# Patient Record
Sex: Male | Born: 2000 | Race: White | Hispanic: No | Marital: Single | Smoking: Former smoker
Health system: Southern US, Community
[De-identification: ages and names within clinical notes are randomized; demographics above are authoritative.]

## PROBLEM LIST (undated history)

## (undated) DIAGNOSIS — F909 Attention-deficit hyperactivity disorder, unspecified type: Secondary | ICD-10-CM

## (undated) DIAGNOSIS — J050 Acute obstructive laryngitis [croup]: Secondary | ICD-10-CM

## (undated) DIAGNOSIS — J3089 Other allergic rhinitis: Secondary | ICD-10-CM

## (undated) HISTORY — PX: NO PAST SURGERIES: SHX2092

## (undated) HISTORY — DX: Acute obstructive laryngitis (croup): J05.0

## (undated) HISTORY — DX: Other allergic rhinitis: J30.89

## (undated) HISTORY — DX: Attention-deficit hyperactivity disorder, unspecified type: F90.9

---
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
## 2021-07-18 ENCOUNTER — Emergency Department (HOSPITAL_BASED_OUTPATIENT_CLINIC_OR_DEPARTMENT_OTHER): Payer: 59

## 2021-07-18 ENCOUNTER — Encounter (HOSPITAL_BASED_OUTPATIENT_CLINIC_OR_DEPARTMENT_OTHER): Payer: Self-pay | Admitting: Emergency Medicine

## 2021-07-18 ENCOUNTER — Ambulatory Visit (INDEPENDENT_AMBULATORY_CARE_PROVIDER_SITE_OTHER)
Admission: EM | Admit: 2021-07-18 | Discharge: 2021-07-18 | Disposition: A | Discharge status: Other Acute Inpatient Hospital | Payer: 59 | Source: Home / Self Care

## 2021-07-18 ENCOUNTER — Ambulatory Visit: Payer: Self-pay

## 2021-07-18 ENCOUNTER — Emergency Department (HOSPITAL_BASED_OUTPATIENT_CLINIC_OR_DEPARTMENT_OTHER)
Admission: EM | Admit: 2021-07-18 | Discharge: 2021-07-18 | Disposition: A | Discharge status: Home / Self Care | Payer: 59 | Attending: Emergency Medicine | Admitting: Emergency Medicine

## 2021-07-18 ENCOUNTER — Other Ambulatory Visit: Payer: Self-pay

## 2021-07-18 DIAGNOSIS — J029 Acute pharyngitis, unspecified: Secondary | ICD-10-CM | POA: Diagnosis not present

## 2021-07-18 DIAGNOSIS — J390 Retropharyngeal and parapharyngeal abscess: Secondary | ICD-10-CM | POA: Diagnosis not present

## 2021-07-18 DIAGNOSIS — R220 Localized swelling, mass and lump, head: Secondary | ICD-10-CM | POA: Diagnosis present

## 2021-07-18 DIAGNOSIS — Z91010 Allergy to peanuts: Secondary | ICD-10-CM | POA: Diagnosis not present

## 2021-07-18 DIAGNOSIS — Z112 Encounter for screening for other bacterial diseases: Secondary | ICD-10-CM | POA: Insufficient documentation

## 2021-07-18 DIAGNOSIS — D72829 Elevated white blood cell count, unspecified: Secondary | ICD-10-CM | POA: Diagnosis not present

## 2021-07-18 LAB — BASIC METABOLIC PANEL
Anion gap: 14 (ref 5–15)
BUN: 9 mg/dL (ref 6–20)
CO2: 24 mmol/L (ref 22–32)
Calcium: 9.6 mg/dL (ref 8.9–10.3)
Chloride: 100 mmol/L (ref 98–111)
Creatinine, Ser: 0.94 mg/dL (ref 0.61–1.24)
GFR, Estimated: >60 mL/min (ref >60)
Glucose, Bld: 94 mg/dL (ref 70–99)
Potassium: 3.9 mmol/L (ref 3.5–5.1)
Sodium: 138 mmol/L (ref 135–145)

## 2021-07-18 LAB — CBC WITH DIFFERENTIAL/PLATELET
Abs Immature Granulocytes: 0.05 10*3/uL (ref 0.00–0.07)
Basophils Absolute: 0 10*3/uL (ref 0.0–0.1)
Basophils Relative: 0 %
Eosinophils Absolute: 0.1 10*3/uL (ref 0.0–0.5)
Eosinophils Relative: 1 %
HCT: 43.3 % (ref 39.0–52.0)
Hemoglobin: 15 g/dL (ref 13.0–17.0)
Immature Granulocytes: 0 %
Lymphocytes Relative: 12 %
Lymphs Abs: 1.6 10*3/uL (ref 0.7–4.0)
MCH: 30.2 pg (ref 26.0–34.0)
MCHC: 34.6 g/dL (ref 30.0–36.0)
MCV: 87.3 fL (ref 80.0–100.0)
Monocytes Absolute: 1.4 10*3/uL — ABNORMAL HIGH (ref 0.1–1.0)
Monocytes Relative: 10 %
Neutro Abs: 10.1 10*3/uL — ABNORMAL HIGH (ref 1.7–7.7)
Neutrophils Relative %: 77 %
Platelets: 259 10*3/uL (ref 150–400)
RBC: 4.96 MIL/uL (ref 4.22–5.81)
RDW: 11.9 % (ref 11.5–15.5)
WBC: 13.2 10*3/uL — ABNORMAL HIGH (ref 4.0–10.5)
nRBC: 0 % (ref 0.0–0.2)

## 2021-07-18 LAB — POCT RAPID STREP A (OFFICE): Rapid Strep A Screen: NEGATIVE

## 2021-07-18 MED ORDER — KETOROLAC TROMETHAMINE 30 MG/ML IJ SOLN
30.0000 mg | Freq: Once | INTRAMUSCULAR | Status: AC
  Administered 2021-07-18: 30 mg via INTRAVENOUS
  Filled 2021-07-18: qty 1

## 2021-07-18 MED ORDER — METHYLPREDNISOLONE 4 MG PO TBPK: ORAL_TABLET | ORAL | 0 refills | Status: AC

## 2021-07-18 MED ORDER — NAPROXEN 500 MG PO TABS: 500.0000 mg | ORAL_TABLET | Freq: Two times a day (BID) | ORAL | 0 refills | Status: AC

## 2021-07-18 MED ORDER — ACETAMINOPHEN 500 MG PO TABS
1000.0000 mg | ORAL_TABLET | Freq: Once | ORAL | Status: AC
  Administered 2021-07-18: 1000 mg via ORAL

## 2021-07-18 MED ORDER — SODIUM CHLORIDE 0.9 % IV BOLUS
1000.0000 mL | Freq: Once | INTRAVENOUS | Status: AC
  Administered 2021-07-18: 1000 mL via INTRAVENOUS

## 2021-07-18 MED ORDER — DEXAMETHASONE SODIUM PHOSPHATE 10 MG/ML IJ SOLN
10.0000 mg | Freq: Once | INTRAMUSCULAR | Status: AC
  Administered 2021-07-18: 10 mg via INTRAVENOUS
  Filled 2021-07-18: qty 1

## 2021-07-18 MED ORDER — IOHEXOL 300 MG/ML  SOLN
100.0000 mL | Freq: Once | INTRAMUSCULAR | Status: AC | PRN
  Administered 2021-07-18: 75 mL via INTRAVENOUS

## 2021-07-18 NOTE — Discharge Instructions (Signed)
You have been seen and discharged from the emergency department.  Strep test was negative. Take prescriptions as prescribed.  Make sure to eat something small/drink something with the Naprosyn to avoid any stomach irritation.  Stay well-hydrated.  Follow-up with your primary provider for further evaluation and further care. Take home medications as prescribed. If you have any worsening symptoms or further concerns for your health please return to an emergency department for further evaluation.

## 2021-07-18 NOTE — Discharge Instructions (Addendum)
-  You have a large abscess in the back of your throat.  This is so big, that I am worried that it could compromise your airway, so this needs to be managed in the emergency department.  They will probably do imaging of your head and neck, then drain the abscess, and decide whether to do inpatient or outpatient antibiotics.

## 2021-07-18 NOTE — ED Triage Notes (Signed)
Pt reports, on and off sore throat and body aches x 5 days. States is hard to swallow. States right side of his throat was bleeding today.

## 2021-07-18 NOTE — ED Provider Notes (Signed)
[facility] Provider Note   CSN: [id number] Arrival date & time: 07/18/21  1603     History  Chief Complaint  Patient presents with   Oral Swelling    Bryan [name] is a 21 y.o. male.  HPI  21 year old male presents emergency department concern for sore throat.  Patient states has been going on for the past 5 days, worse on the right.  He is not having difficulty swallowing with severe pain.  Intermittent fevers.  Was reportedly seen in urgent care prior to this, they believe that his strep test was positive however they were concerned for abscess and sent him here for imaging and further treatment.  Patient denies any drooling or change in voice just states that it is very sore to speak/swallow.  Is never had any symptoms like this before.  Home Medications Prior to Admission medications   Medication Sig Start Date End Date Taking? Authorizing Provider  acetaminophen (TYLENOL) 500 MG tablet Take 1,000 mg by mouth every 6 (six) hours as needed for headache.    [provider]  EPINEPHrine (EPIPEN 2-PAK) 0.3 mg/0.3 mL IJ SOAJ injection Inject 0.3 mLs (0.3 mg total) into the muscle once. 07/20/15   Gean Quint, MD  Vitamin D, Ergocalciferol, (DRISDOL) 50000 units CAPS capsule Take 1 capsule (50,000 Units total) by mouth every 7 (seven) days. 10/07/17   Laqueta Linden, MD      Allergies    Peanut-containing drug products, Amoxil [amoxicillin], and Other    Review of Systems   Review of Systems  Constitutional:  Positive for appetite change, fatigue and fever.  HENT:  Positive for sore throat and trouble swallowing. Negative for congestion, drooling, facial swelling, sinus pressure and voice change.   Respiratory:  Negative for shortness of breath.   Cardiovascular:  Negative for chest pain.  Gastrointestinal:  Negative for abdominal pain, diarrhea and vomiting.  Musculoskeletal:  Negative for neck pain and neck stiffness.  Skin:   Negative for rash.  Neurological:  Negative for headaches.   Physical Exam Updated Vital Signs BP (!) 133/101    Pulse 100    Temp 99.3 F (37.4 C) (Oral)    Resp 18    SpO2 98%  Physical Exam Vitals and nursing note reviewed.  Constitutional:      Appearance: Normal appearance.  HENT:     Head: Normocephalic.     Mouth/Throat:     Comments: Trismus, pharyngeal erythema with tonsillar exudates, large amount of right tonsillar swelling, uvula is midline Neck:     Comments: No stridor Cardiovascular:     Rate and Rhythm: Normal rate.  Pulmonary:     Effort: Pulmonary effort is normal. No respiratory distress.  Abdominal:     Palpations: Abdomen is soft.     Tenderness: There is no abdominal tenderness.  Skin:    General: Skin is warm.  Neurological:     Mental Status: He is alert and oriented to person, place, and time. Mental status is at baseline.  Psychiatric:        Mood and Affect: Mood normal.    ED Results / Procedures / Treatments   Labs (all labs ordered are listed, but only abnormal results are displayed) Labs Reviewed  CBC WITH DIFFERENTIAL/PLATELET  BASIC METABOLIC PANEL    EKG None  Radiology No results found.  Procedures Procedures    Medications Ordered in ED Medications  sodium chloride 0.9 % bolus 1,000 mL (has no administration in  time range)  ketorolac (TORADOL) 30 MG/ML injection 30 mg (has no administration in time range)  dexamethasone (DECADRON) injection 10 mg (has no administration in time range)    ED Course/ Medical Decision Making/ A&P                           Medical Decision Making Amount and/or Complexity of Data Reviewed Labs: ordered. Radiology: ordered.  Risk Prescription drug management.   22 year old male presents emergency department from urgent care with concern for tonsillar swelling and potential of peritonsillar abscess.  Patient states for the past 5 days he has been having a sore throat, mild swelling, pain  with swallowing.  Intermittent fever.  Had a strep test at urgent care that was negative.  Patient is afebrile here.  He does have tonsillar swelling, right greater than left, uvula is midline.  Palpable cervical lymphadenopathy.  Blood work shows a mild leukocytosis of 13 but is otherwise reassuring.  CT of the neck shows pharyngitis with severe tonsillar swelling and cervical lymphadenopathy without any focal abscess.  After IV medications patient feels significantly improved, has been able to eat and drink.  Plan for outpatient symptomatic treatment and follow-up.  Patient at this time appears safe and stable for discharge and close outpatient follow up. Discharge plan and strict return to ED precautions discussed, patient verbalizes understanding and agreement.        Final Clinical Impression(s) / ED Diagnoses Final diagnoses:  None    Rx / DC Orders ED Discharge Orders     None         Lorelle Konecny, DO 07/18/21 2121

## 2021-07-18 NOTE — ED Provider Notes (Signed)
[facility]    CSN: [id number] Arrival date & time: 07/18/21  1455      History   Chief Complaint Chief Complaint  Patient presents with   Appointment    1500   Sore Throat        Generalized Body Aches    HPI Bryan [name] is a 21 y.o. male presenting with sore throat, trouble swallowing, shortness of breath.  History noncontributory.  Has not attempted medications today.  Difficulty handling secretions and swallowing.  HPI  Past Medical History:  Diagnosis Date   ADHD (attention deficit hyperactivity disorder)    Croup    Recurrent with reactive airway   Environmental and seasonal allergies     Patient Active Problem List   Diagnosis Date Noted   Allergic rhinitis 08/11/2012   Reactive airway disease 08/11/2012    Past Surgical History:  Procedure Laterality Date   NO PAST SURGERIES         Home Medications    Prior to Admission medications   Medication Sig Start Date End Date Taking? Authorizing Provider  acetaminophen (TYLENOL) 500 MG tablet Take 1,000 mg by mouth every 6 (six) hours as needed for headache.    [provider]  EPINEPHrine (EPIPEN 2-PAK) 0.3 mg/0.3 mL IJ SOAJ injection Inject 0.3 mLs (0.3 mg total) into the muscle once. 07/20/15   Gean Quint, MD  Vitamin D, Ergocalciferol, (DRISDOL) 50000 units CAPS capsule Take 1 capsule (50,000 Units total) by mouth every 7 (seven) days. 10/07/17   Laqueta Linden, MD    Family History Family History  Problem Relation Age of Onset   Diabetes Paternal Uncle    Diabetes Paternal Grandfather        colon   Urticaria Father    Allergic rhinitis Father    Asthma Paternal Grandmother    Angioedema Neg Hx    Atopy Neg Hx    Eczema Neg Hx    Immunodeficiency Neg Hx     Social History Social History   Tobacco Use   Smoking status: Former    Types: Cigarettes   Smokeless tobacco: Never   Tobacco comments:    noone in  home smokes  Substance Use Topics    Alcohol use: Yes   Drug use: Never     Allergies   Peanut-containing drug products, Amoxil [amoxicillin], and Other   Review of Systems Review of Systems  Constitutional:  Negative for appetite change, chills and fever.  HENT:  Positive for sore throat, trouble swallowing and voice change. Negative for congestion, ear pain, rhinorrhea, sinus pressure and sinus pain.   Eyes:  Negative for redness and visual disturbance.  Respiratory:  Negative for cough, chest tightness, shortness of breath and wheezing.   Cardiovascular:  Negative for chest pain and palpitations.  Gastrointestinal:  Negative for abdominal pain, constipation, diarrhea, nausea and vomiting.  Genitourinary:  Negative for dysuria, frequency and urgency.  Musculoskeletal:  Negative for myalgias.  Neurological:  Negative for dizziness, weakness and headaches.  Psychiatric/Behavioral:  Negative for confusion.   All other systems reviewed and are negative.   Physical Exam Triage Vital Signs ED Triage Vitals  Enc Vitals Group     BP 07/18/21 1502 (!) 142/84     Pulse Rate 07/18/21 1502 (!) 108     Resp 07/18/21 1502 18     Temp 07/18/21 1502 (!) 101.1 F (38.4 C)     Temp Source 07/18/21 1502 Oral     SpO2  07/18/21 1502 96 %     Weight --      Height --      Head Circumference --      Peak Flow --      Pain Score 07/18/21 1503 9     Pain Loc --      Pain Edu? --      Excl. in [place]? --    No data found.  Updated Vital Signs BP (!) 142/84 (BP Location: Right Arm)    Pulse (!) 108    Temp (!) 101.1 F (38.4 C) (Oral)    Resp 18    SpO2 96%   Visual Acuity Right Eye Distance:   Left Eye Distance:   Bilateral Distance:    Right Eye Near:   Left Eye Near:    Bilateral Near:     Physical Exam Vitals reviewed.  Constitutional:      General: He is not in acute distress.    Appearance: Normal appearance. He is not ill-appearing.  HENT:     Head: Normocephalic and atraumatic.     Right Ear: Tympanic  membrane, ear canal and external ear normal. No tenderness. No middle ear effusion. There is no impacted cerumen. Tympanic membrane is not perforated, erythematous, retracted or bulging.     Left Ear: Tympanic membrane, ear canal and external ear normal. No tenderness.  No middle ear effusion. There is no impacted cerumen. Tympanic membrane is not perforated, erythematous, retracted or bulging.     Nose: Nose normal. No congestion.     Mouth/Throat:     Mouth: Mucous membranes are moist.     Pharynx: Uvula midline. Posterior oropharyngeal erythema present. No oropharyngeal exudate.     Tonsils: Tonsillar abscess present. 2+ on the right. 1+ on the left.     Comments: Right tonsil is distended towards uvula; uvula is not midline.  Muffled phonation, trismus. Eyes:     Extraocular Movements: Extraocular movements intact.     Pupils: Pupils are equal, round, and reactive to light.  Cardiovascular:     Rate and Rhythm: Normal rate and regular rhythm.     Heart sounds: Normal heart sounds.  Pulmonary:     Effort: Pulmonary effort is normal.     Breath sounds: Normal breath sounds. No decreased breath sounds, wheezing, rhonchi or rales.  Abdominal:     Palpations: Abdomen is soft.     Tenderness: There is no abdominal tenderness. There is no guarding or rebound.  Lymphadenopathy:     Cervical: No cervical adenopathy.     Right cervical: No superficial cervical adenopathy.    Left cervical: No superficial cervical adenopathy.  Neurological:     General: No focal deficit present.     Mental Status: He is alert and oriented to person, place, and time.  Psychiatric:        Mood and Affect: Mood normal.        Behavior: Behavior normal.        Thought Content: Thought content normal.        Judgment: Judgment normal.     UC Treatments / Results  Labs (all labs ordered are listed, but only abnormal results are displayed) Labs Reviewed  POCT RAPID STREP A (OFFICE)     EKG   Radiology No results found.  Procedures Procedures (including critical care time)  Medications Ordered in UC Medications  acetaminophen (TYLENOL) tablet 1,000 mg (1,000 mg Oral Given 07/18/21 1508)    Initial Impression / Assessment  and Plan / UC Course  I have reviewed the triage vital signs and the nursing notes.  Pertinent labs & imaging results that were available during my care of the patient were reviewed by me and considered in my medical decision making (see chart for details).     This patient is a very pleasant 21 y.o. year old male presenting with retropharyngeal abscess. Tachycardic and febrile; acetaminophen administered during visit. Rapid strep negative, culture sent.  Given change in phonation, difficulty handling secretions, and trismus, I am concerned that the abscess is too significant to be managed outpatient.  Sent to emergency department via personal vehicle, he states he will head straight there..   Final Clinical Impressions(s) / UC Diagnoses   Final diagnoses:  Retropharyngeal abscess  Screening for streptococcal infection     Discharge Instructions      -You have a large abscess in the back of your throat.  This is so big, that I am worried that it could compromise your airway, so this needs to be managed in the emergency department.  They will probably do imaging of your head and neck, then drain the abscess, and decide whether to do inpatient or outpatient antibiotics.   ED Prescriptions   None    PDMP not reviewed this encounter.   Hazel Sams, PA-C 07/18/21 1523

## 2021-07-18 NOTE — ED Triage Notes (Signed)
Pt states for about a week has been congested and sore throat. Last night his throat felt like it was swelling, extremely painful . Seen at urgent care ,tested positive for strep and sent here as provider saw large lump and concerned for patients airway.

## 2021-07-18 NOTE — ED Notes (Signed)
Patient is being discharged from the Urgent Care and sent to the Emergency Department via private vehicle . Per PA, patient is in need of higher level of care due to throat abscess. Patient is aware and verbalizes understanding of plan of care.

## 2021-07-21 LAB — CULTURE, GROUP A STREP (THRC)

## 2021-09-18 ENCOUNTER — Ambulatory Visit
Admission: RE | Admit: 2021-09-18 | Discharge: 2021-09-18 | Disposition: A | Discharge status: Home / Self Care | Payer: Commercial Managed Care - PPO | Source: Ambulatory Visit | Attending: Chiropractic Medicine | Admitting: Chiropractic Medicine

## 2021-09-18 ENCOUNTER — Other Ambulatory Visit: Payer: Self-pay | Admitting: Chiropractic Medicine

## 2021-09-18 DIAGNOSIS — M79604 Pain in right leg: Secondary | ICD-10-CM

## 2022-05-10 ENCOUNTER — Ambulatory Visit (INDEPENDENT_AMBULATORY_CARE_PROVIDER_SITE_OTHER): Payer: Commercial Managed Care - PPO

## 2022-05-10 ENCOUNTER — Ambulatory Visit
Admission: EM | Admit: 2022-05-10 | Discharge: 2022-05-10 | Disposition: A | Discharge status: Home / Self Care | Payer: Commercial Managed Care - PPO | Attending: Emergency Medicine | Admitting: Emergency Medicine

## 2022-05-10 DIAGNOSIS — U071 COVID-19: Secondary | ICD-10-CM | POA: Diagnosis not present

## 2022-05-10 DIAGNOSIS — R051 Acute cough: Secondary | ICD-10-CM

## 2022-05-10 DIAGNOSIS — R112 Nausea with vomiting, unspecified: Secondary | ICD-10-CM

## 2022-05-10 MED ORDER — MOLNUPIRAVIR EUA 200MG CAPSULE: 4.0000 | ORAL_CAPSULE | Freq: Two times a day (BID) | ORAL | 0 refills | Status: AC

## 2022-05-10 MED ORDER — ALBUTEROL SULFATE HFA 108 (90 BASE) MCG/ACT IN AERS: 1.0000 | INHALATION_SPRAY | Freq: Four times a day (QID) | RESPIRATORY_TRACT | 0 refills | Status: AC | PRN

## 2022-05-10 NOTE — ED Triage Notes (Signed)
Patient to Urgent Care with complaints of nasal congestion/ wheezing/ cough/ and generalized body aches x2 days. Also reports nausea and vomiting.   Reports that he had a positive covid test at home two days ago.   Fevers yesterday, max temp 102. Last dose of tylenol 2130.

## 2022-05-10 NOTE — Discharge Instructions (Addendum)
Use the albuterol inhaler as directed.  Take the molnupiravir as directed.    Go to the emergency department if you have shortness of breath or other concerning symptoms.   Follow up with your primary care provider.

## 2022-05-10 NOTE — ED Provider Notes (Signed)
Bryan [name]    CSN: [id number] Arrival date & time: 05/10/22  0855      History   Chief Complaint Chief Complaint  Patient presents with   Covid Positive    HPI Bryan [name] is a 21 y.o. male.  Patient presents with 2 day history of fever, body aches, congestion, wheezing, cough, nausea, vomiting. Tmax 102.  One episode of emesis last night; none today.  He tested COVID positive at home on 05/08/2022.  Treatment at home with Tylenol; last taken yesterday.  He denies shortness of breath, chest pain, abdominal pain, diarrhea, or other symptoms.  His medical history includes reactive airway disease, allergies, croup, ADHD.   Patient reports he has not required an inhaler in many years.    The history is provided by the patient and medical records.    Past Medical History:  Diagnosis Date   ADHD (attention deficit hyperactivity disorder)    Croup    Recurrent with reactive airway   Environmental and seasonal allergies     Patient Active Problem List   Diagnosis Date Noted   Allergic rhinitis 08/11/2012   Reactive airway disease 08/11/2012    Past Surgical History:  Procedure Laterality Date   NO PAST SURGERIES         Home Medications    Prior to Admission medications   Medication Sig Start Date End Date Taking? Authorizing Provider  albuterol (VENTOLIN HFA) 108 (90 Base) MCG/ACT inhaler Inhale 1-2 puffs into the lungs every 6 (six) hours as needed. 05/10/22  Yes Mickie Bail, NP  molnupiravir EUA (LAGEVRIO) 200 mg CAPS capsule Take 4 capsules (800 mg total) by mouth 2 (two) times daily for 5 days. 05/10/22 05/15/22 Yes Mickie Bail, NP  acetaminophen (TYLENOL) 500 MG tablet Take 1,000 mg by mouth every 6 (six) hours as needed for headache.    [provider]  EPINEPHrine (EPIPEN 2-PAK) 0.3 mg/0.3 mL IJ SOAJ injection Inject 0.3 mLs (0.3 mg total) into the muscle once. 07/20/15   Baxter Hire, MD  methylPREDNISolone (MEDROL DOSEPAK) 4 MG TBPK  tablet Take as directed starting 2/29/23 07/18/21   Horton, Clabe Seal, DO  naproxen (NAPROSYN) 500 MG tablet Take 1 tablet (500 mg total) by mouth 2 (two) times daily. 07/18/21   Horton, Clabe Seal, DO  Vitamin D, Ergocalciferol, (DRISDOL) 50000 units CAPS capsule Take 1 capsule (50,000 Units total) by mouth every 7 (seven) days. 10/07/17   Langston Reusing, MD    Family History Family History  Problem Relation Age of Onset   Diabetes Paternal Uncle    Diabetes Paternal Grandfather        colon   Urticaria Father    Allergic rhinitis Father    Asthma Paternal Grandmother    Angioedema Neg Hx    Atopy Neg Hx    Eczema Neg Hx    Immunodeficiency Neg Hx     Social History Social History   Tobacco Use   Smoking status: Former    Types: Cigarettes   Smokeless tobacco: Never   Tobacco comments:    noone in  home smokes  Substance Use Topics   Alcohol use: Yes   Drug use: Never     Allergies   Peanut-containing drug products, Amoxil [amoxicillin], and Other   Review of Systems Review of Systems  Constitutional:  Positive for fever. Negative for chills.  HENT:  Positive for congestion. Negative for ear pain and sore throat.   Respiratory:  Positive for cough and wheezing. Negative for shortness of breath.   Cardiovascular:  Negative for chest pain and palpitations.  Gastrointestinal:  Positive for nausea and vomiting. Negative for abdominal pain and diarrhea.  Skin:  Negative for rash.  All other systems reviewed and are negative.    Physical Exam Triage Vital Signs ED Triage Vitals  Enc Vitals Group     BP      Pulse      Resp      Temp      Temp src      SpO2      Weight      Height      Head Circumference      Peak Flow      Pain Score      Pain Loc      Pain Edu?      Excl. in GC?    No data found.  Updated Vital Signs BP 118/85   Pulse 91   Temp 97.7 F (36.5 C)   Resp 18   Ht 6\' 1"  (1.854 m)   SpO2 97%   Visual Acuity Right Eye  Distance:   Left Eye Distance:   Bilateral Distance:    Right Eye Near:   Left Eye Near:    Bilateral Near:     Physical Exam Vitals and nursing note reviewed.  Constitutional:      General: He is not in acute distress.    Appearance: He is well-developed. He is obese. He is ill-appearing.  HENT:     Right Ear: Tympanic membrane normal.     Left Ear: Tympanic membrane normal.     Nose: Congestion and rhinorrhea present.     Mouth/Throat:     Mouth: Mucous membranes are moist.     Pharynx: Oropharynx is clear.  Cardiovascular:     Rate and Rhythm: Normal rate and regular rhythm.     Heart sounds: Normal heart sounds.  Pulmonary:     Effort: Pulmonary effort is normal. No respiratory distress.     Breath sounds: Rhonchi present.     Comments: Left side rhonchi Abdominal:     General: Bowel sounds are normal.     Palpations: Abdomen is soft.     Tenderness: There is no abdominal tenderness. There is no guarding or rebound.  Musculoskeletal:     Cervical back: Neck supple.  Skin:    General: Skin is warm and dry.  Neurological:     Mental Status: He is alert.  Psychiatric:        Mood and Affect: Mood normal.        Behavior: Behavior normal.      UC Treatments / Results  Labs (all labs ordered are listed, but only abnormal results are displayed) Labs Reviewed - No data to display  EKG   Radiology DG Chest 2 View  Result Date: 05/10/2022 CLINICAL DATA:  productive cough, COVID positive EXAM: CHEST - 2 VIEW COMPARISON:  02/28/2015 FINDINGS: Cardiac silhouette is unremarkable. No pneumothorax or pleural effusion. The lungs are clear. The visualized skeletal structures are unremarkable. IMPRESSION: No acute cardiopulmonary process. Electronically Signed   By: 04/30/2015 M.D.   On: 05/10/2022 09:42    Procedures Procedures (including critical care time)  Medications Ordered in UC Medications - No data to display  Initial Impression / Assessment and Plan /  UC Course  I have reviewed the triage vital signs and the nursing notes.  Pertinent labs &  imaging results that were available during my care of the patient were reviewed by me and considered in my medical decision making (see chart for details).    COVID-19, cough, nausea and vomiting.  VSS.  CXR negative.  Discussed treatment options.  Treating with molnupiravir .  Discussed that this is an emergency authorized medication for treatment of COVID.  Discussed side effects including nausea, diarrhea, dizziness.  Also treating with albuterol inhaler as patient has history of reactive airway.  Discussed other symptomatic treatment including Tylenol, rest, hydration.  Instructed patient to follow-up with PCP if symptoms are not improving.  ED precautions discussed.  Patient agrees to plan of care.   Final Clinical Impressions(s) / UC Diagnoses   Final diagnoses:  COVID-19  Acute cough  Nausea and vomiting, unspecified vomiting type     Discharge Instructions      Use the albuterol inhaler as directed.  Take the molnupiravir as directed.    Go to the emergency department if you have shortness of breath or other concerning symptoms.   Follow up with your primary care provider.        ED Prescriptions     Medication Sig Dispense Auth. Provider   albuterol (VENTOLIN HFA) 108 (90 Base) MCG/ACT inhaler Inhale 1-2 puffs into the lungs every 6 (six) hours as needed. 18 g Mickie Bail, NP   molnupiravir EUA (LAGEVRIO) 200 mg CAPS capsule Take 4 capsules (800 mg total) by mouth 2 (two) times daily for 5 days. 40 capsule Mickie Bail, NP      PDMP not reviewed this encounter.   Mickie Bail, NP 05/10/22 1004

## 2022-11-15 IMAGING — CR DG LUMBAR SPINE COMPLETE 4+V
5 series · 5 of 5 positions shown · non-contrast
Comparison: None.

CLINICAL DATA: Acute low back pain radiating down right leg to
foot. Numbness and tingling, right foot.

EXAM:
LUMBAR SPINE - COMPLETE 4+ VIEW

[w lumbar spine ap]
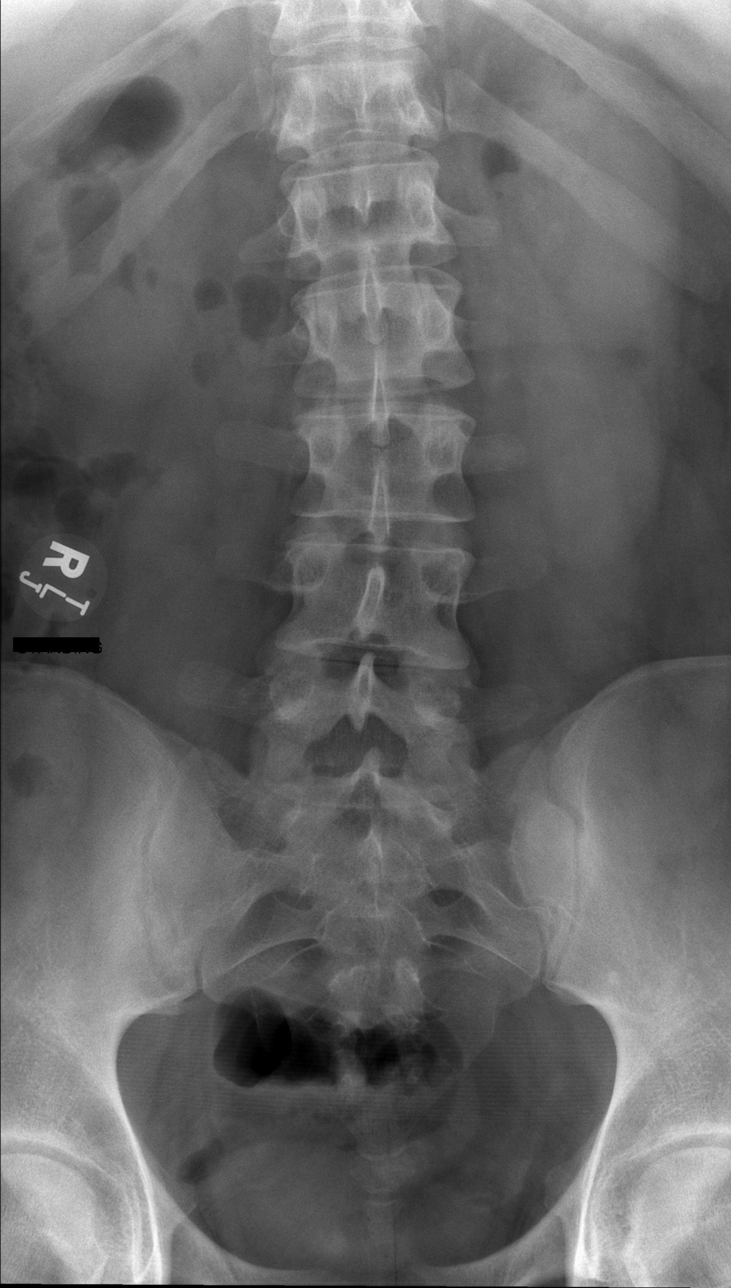

[w lumbar spine obl (1 of 2)]
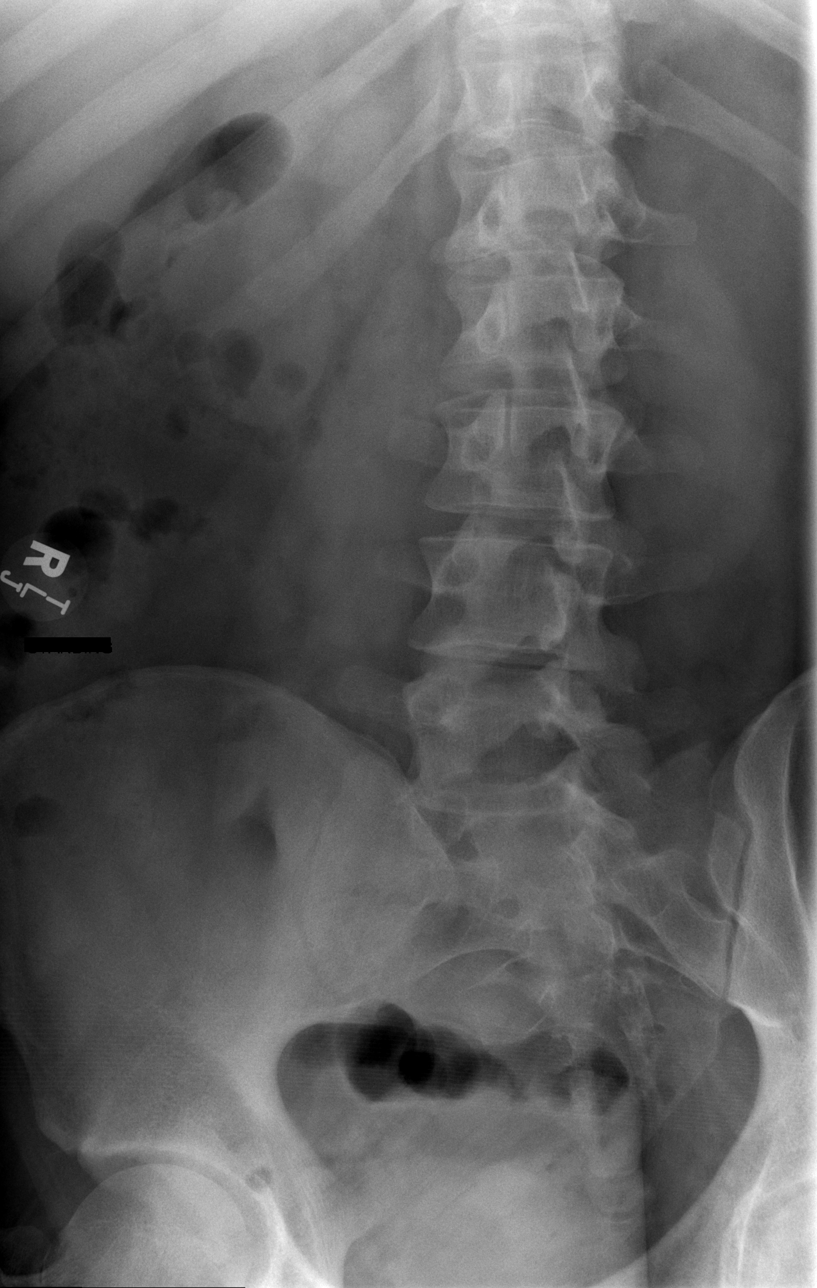

[w lumbar spine obl (2 of 2)]
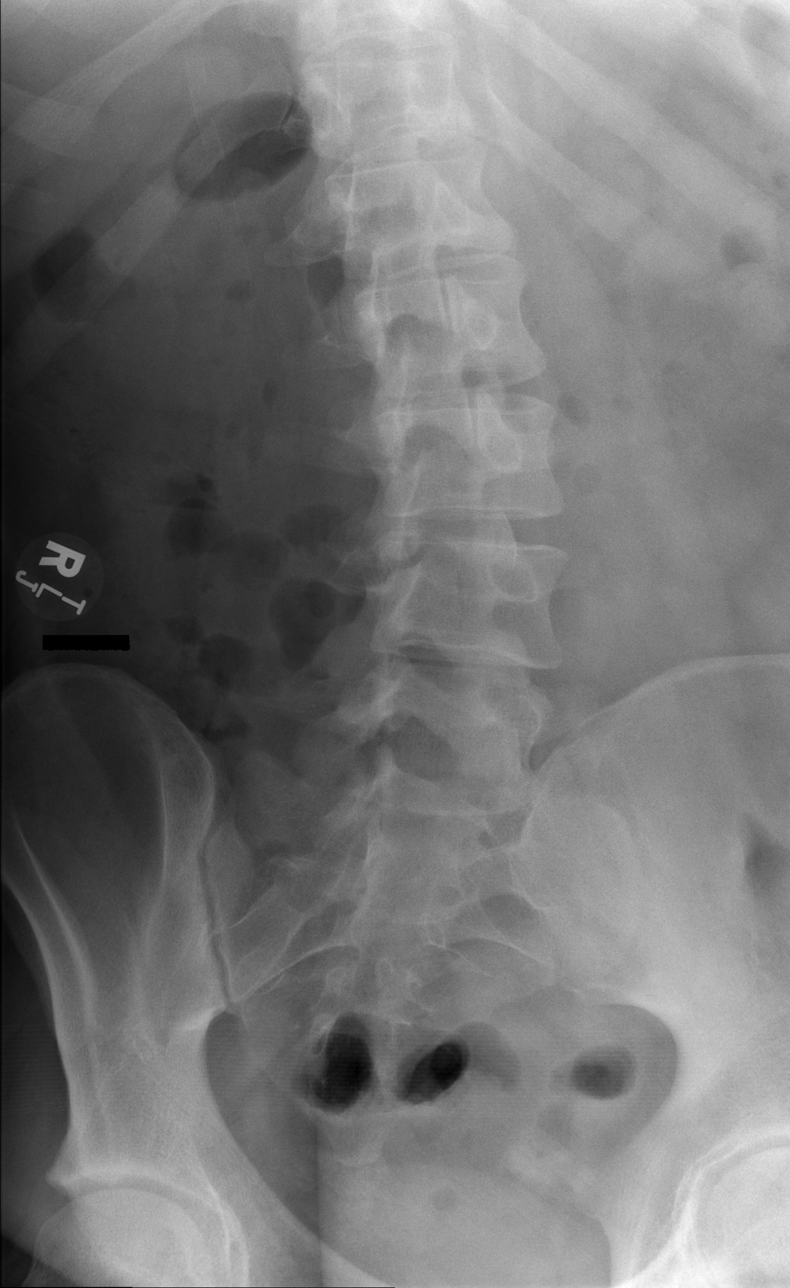

[w lumbar spine lat]
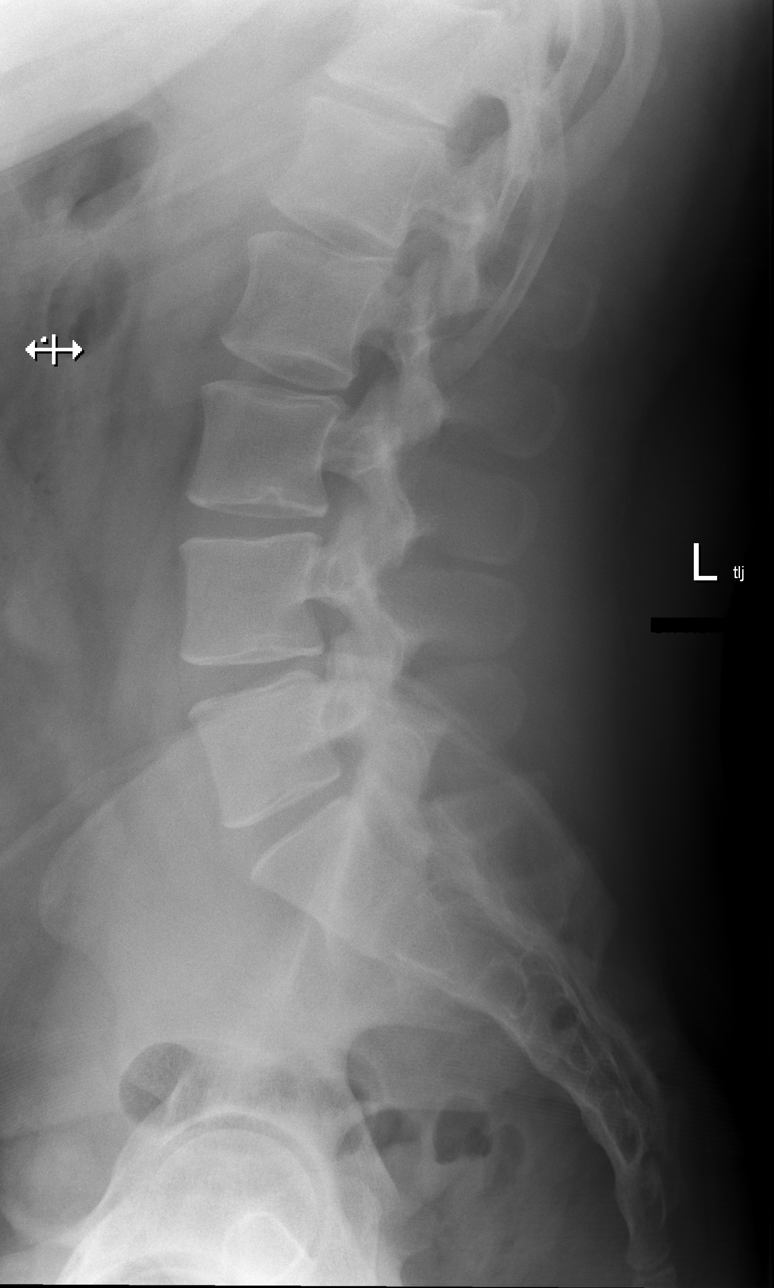

[w lumbar l-5 s-1 spot]
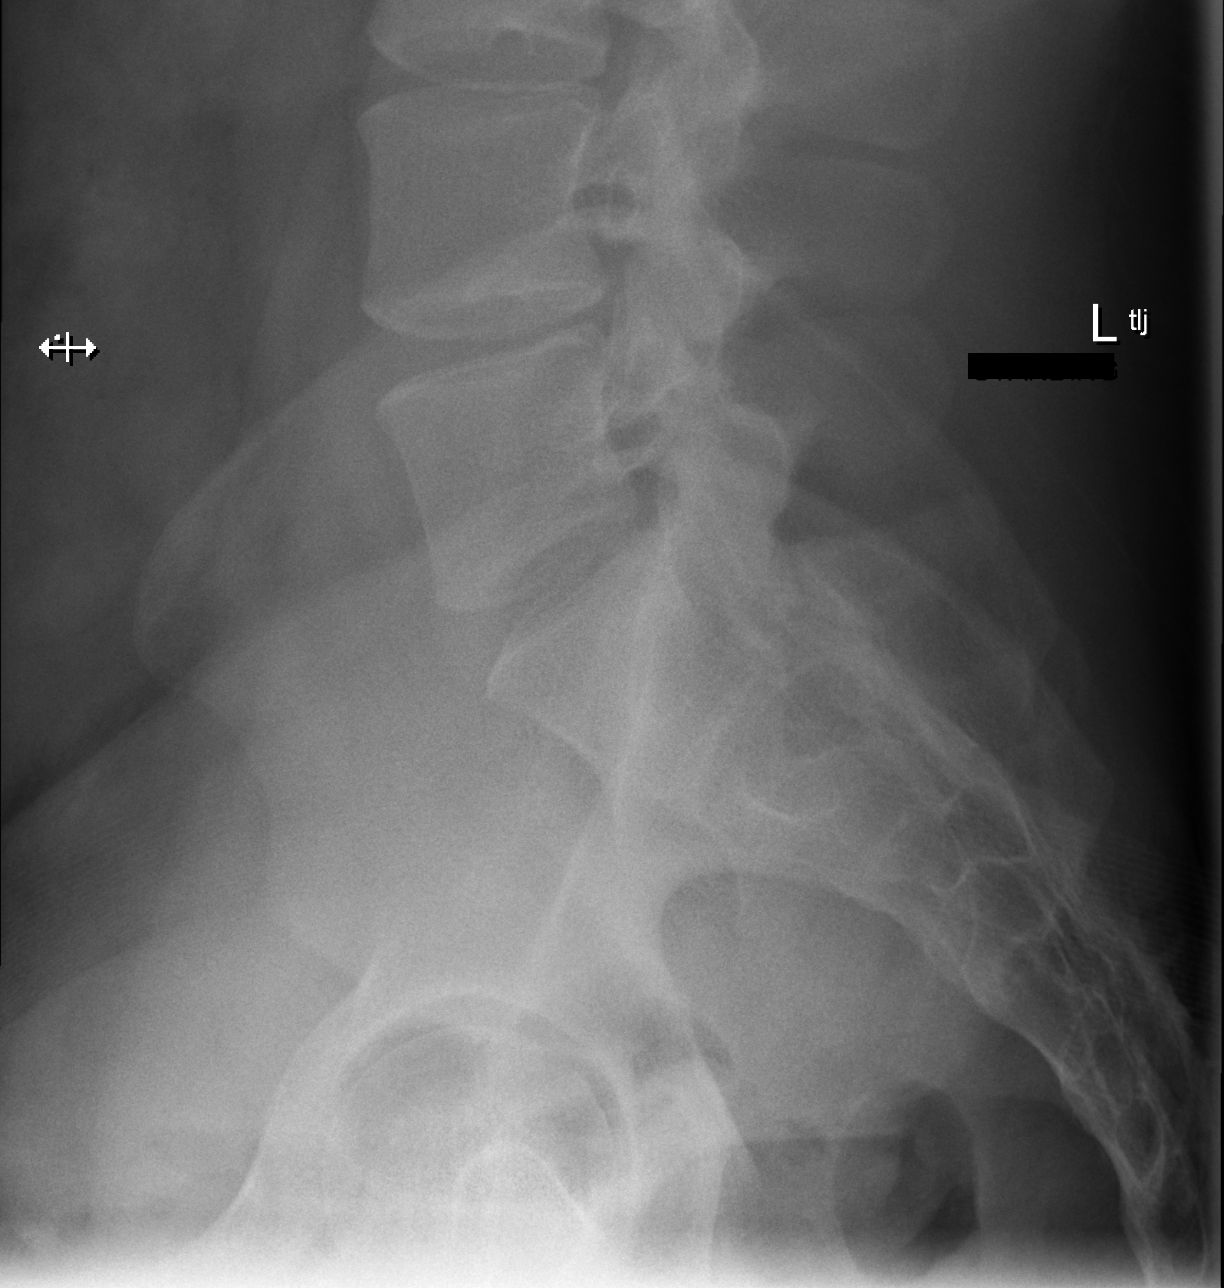

[5 of 5 positions shown; findings below may reference images not displayed]

FINDINGS: There is no evidence of lumbar spine fracture. Alignment is normal.
Intervertebral disc spaces are maintained.
IMPRESSION: Negative.

## 2023-03-15 ENCOUNTER — Ambulatory Visit
Admission: RE | Admit: 2023-03-15 | Discharge: 2023-03-15 | Disposition: A | Discharge status: Home / Self Care | Payer: Commercial Managed Care - PPO | Source: Ambulatory Visit | Attending: Emergency Medicine | Admitting: Emergency Medicine

## 2023-03-15 VITALS — BP 168/82 | HR 60 | Temp 98.1°F | Resp 18

## 2023-03-15 DIAGNOSIS — J069 Acute upper respiratory infection, unspecified: Secondary | ICD-10-CM

## 2023-03-15 MED ORDER — AZITHROMYCIN 250 MG PO TABS: 250.0000 mg | ORAL_TABLET | Freq: Every day | ORAL | 0 refills | Status: AC

## 2023-03-15 MED ORDER — PREDNISONE 20 MG PO TABS: 40.0000 mg | ORAL_TABLET | Freq: Every day | ORAL | 0 refills | Status: AC

## 2023-03-15 MED ORDER — PROMETHAZINE-DM 6.25-15 MG/5ML PO SYRP: 5.0000 mL | ORAL_SOLUTION | Freq: Four times a day (QID) | ORAL | 0 refills | Status: AC | PRN

## 2023-03-15 MED ORDER — BENZONATATE 100 MG PO CAPS: 100.0000 mg | ORAL_CAPSULE | Freq: Three times a day (TID) | ORAL | 0 refills | Status: AC

## 2023-03-15 NOTE — Discharge Instructions (Addendum)
Based on the description of your symptoms antibiotics will be initiated today  Begin azithromycin as directed  Begin prednisone every morning with food for 5 days to open and relax the airway, should help calm shortness of breath and wheezing  May use Tessalon pill every 8 hours as needed to help calm coughing, may use cough syrup every 6 hours additionally, be mindful this may make you feel sleepy  No improvement seen within 72 hours of medication use please return to clinic for reevaluation and to complete chest x-ray imaging    You can take Tylenol and/or Ibuprofen as needed for fever reduction and pain relief.   For cough: honey 1/2 to 1 teaspoon (you can dilute the honey in water or another fluid).  You can also use guaifenesin and dextromethorphan for cough. You can use a humidifier for chest congestion and cough.  If you don't have a humidifier, you can sit in the bathroom with the hot shower running.      For sore throat: try warm salt water gargles, cepacol lozenges, throat spray, warm tea or water with lemon/honey, popsicles or ice, or OTC cold relief medicine for throat discomfort.   For congestion: take a daily anti-histamine like Zyrtec, Claritin, and a oral decongestant, such as pseudoephedrine.  You can also use Flonase 1-2 sprays in each nostril daily.   It is important to stay hydrated: drink plenty of fluids (water, gatorade/powerade/pedialyte, juices, or teas) to keep your throat moisturized and help further relieve irritation/discomfort.

## 2023-03-15 NOTE — ED Provider Notes (Signed)
Bryan [name]    CSN: [id number] Arrival date & time: 03/15/23  1247      History   Chief Complaint Chief Complaint  Patient presents with   Fever   Nasal Congestion   Cough    HPI Bryan [name] is a 22 y.o. male.   Patient presents for evaluation of nasal congestion, rhinorrhea, chest congestion, shortness of breath with exertion present for 7 days.  Endorses associated wheezing.  Cough is worse at nighttime.  Endorses blood-tinged sputum that has been occurring more frequently as time has progressed.  Denies respiratory history, former smoker.  No known sick contacts prior.  Tolerating food and liquids.  Past Medical History:  Diagnosis Date   ADHD (attention deficit hyperactivity disorder)    Croup    Recurrent with reactive airway   Environmental and seasonal allergies     Patient Active Problem List   Diagnosis Date Noted   Allergic rhinitis 08/11/2012   Reactive airway disease 08/11/2012    Past Surgical History:  Procedure Laterality Date   NO PAST SURGERIES         Home Medications    Prior to Admission medications   Medication Sig Start Date End Date Taking? Authorizing Provider  azithromycin (ZITHROMAX) 250 MG tablet Take 1 tablet (250 mg total) by mouth daily. Take first 2 tablets together, then 1 every day until finished. 03/15/23  Yes Yaris Ferrell R, NP  benzonatate (TESSALON) 100 MG capsule Take 1 capsule (100 mg total) by mouth every 8 (eight) hours. 03/15/23  Yes Sharne Linders R, NP  predniSONE (DELTASONE) 20 MG tablet Take 2 tablets (40 mg total) by mouth daily. 03/15/23  Yes Sekai Gitlin R, NP  promethazine-dextromethorphan (PROMETHAZINE-DM) 6.25-15 MG/5ML syrup Take 5 mLs by mouth 4 (four) times daily as needed for cough. 03/15/23  Yes Kassie Keng, Elita Boone, NP  acetaminophen (TYLENOL) 500 MG tablet Take 1,000 mg by mouth every 6 (six) hours as needed for headache.    [provider]  albuterol (VENTOLIN HFA) 108 (90  Base) MCG/ACT inhaler Inhale 1-2 puffs into the lungs every 6 (six) hours as needed. 05/10/22   Mickie Bail, NP  EPINEPHrine (EPIPEN 2-PAK) 0.3 mg/0.3 mL IJ SOAJ injection Inject 0.3 mLs (0.3 mg total) into the muscle once. 07/20/15   Baxter Hire, MD  methylPREDNISolone (MEDROL DOSEPAK) 4 MG TBPK tablet Take as directed starting 2/29/23 07/18/21   Horton, Clabe Seal, DO  naproxen (NAPROSYN) 500 MG tablet Take 1 tablet (500 mg total) by mouth 2 (two) times daily. 07/18/21   Horton, Clabe Seal, DO  Vitamin D, Ergocalciferol, (DRISDOL) 50000 units CAPS capsule Take 1 capsule (50,000 Units total) by mouth every 7 (seven) days. 10/07/17   Langston Reusing, MD    Family History Family History  Problem Relation Age of Onset   Diabetes Paternal Uncle    Diabetes Paternal Grandfather        colon   Urticaria Father    Allergic rhinitis Father    Asthma Paternal Grandmother    Angioedema Neg Hx    Atopy Neg Hx    Eczema Neg Hx    Immunodeficiency Neg Hx     Social History Social History   Tobacco Use   Smoking status: Former    Types: Cigarettes   Smokeless tobacco: Never   Tobacco comments:    noone in  home smokes  Vaping Use   Vaping status: Never Used  Substance Use Topics   Alcohol  use: Yes   Drug use: Never     Allergies   Daucus carota, Peanut-containing drug products, Amoxil [amoxicillin], and Other   Review of Systems Review of Systems  Constitutional:  Positive for fever.  Respiratory:  Positive for cough.      Physical Exam Triage Vital Signs ED Triage Vitals  Encounter Vitals Group     BP 03/15/23 1307 117/81     Systolic BP Percentile --      Diastolic BP Percentile --      Pulse Rate 03/15/23 1307 70     Resp 03/15/23 1307 18     Temp 03/15/23 1307 98.1 F (36.7 C)     Temp Source 03/15/23 1307 Oral     SpO2 03/15/23 1307 97 %     Weight --      Height --      Head Circumference --      Peak Flow --      Pain Score 03/15/23 1309 4     Pain  Loc --      Pain Education --      Exclude from Growth Chart --    No data found.  Updated Vital Signs BP (!) 168/82 (BP Location: Left Arm)   Pulse 60   Temp 98.1 F (36.7 C) (Oral)   Resp 18   SpO2 97%   Visual Acuity Right Eye Distance:   Left Eye Distance:   Bilateral Distance:    Right Eye Near:   Left Eye Near:    Bilateral Near:     Physical Exam Constitutional:      Appearance: Normal appearance.  HENT:     Right Ear: Tympanic membrane, ear canal and external ear normal.     Left Ear: Tympanic membrane, ear canal and external ear normal.     Nose: Congestion present. No rhinorrhea.     Mouth/Throat:     Pharynx: Posterior oropharyngeal erythema present. No oropharyngeal exudate.     Comments: Severe blistering across the upper and lower lips Eyes:     Extraocular Movements: Extraocular movements intact.  Cardiovascular:     Rate and Rhythm: Normal rate and regular rhythm.     Pulses: Normal pulses.     Heart sounds: Normal heart sounds.  Pulmonary:     Effort: Pulmonary effort is normal.     Breath sounds: Normal breath sounds.  Musculoskeletal:     Cervical back: Normal range of motion and neck supple.  Neurological:     Mental Status: He is alert and oriented to person, place, and time. Mental status is at baseline.      UC Treatments / Results  Labs (all labs ordered are listed, but only abnormal results are displayed) Labs Reviewed - No data to display  EKG   Radiology No results found.  Procedures Procedures (including critical care time)  Medications Ordered in UC Medications - No data to display  Initial Impression / Assessment and Plan / UC Course  I have reviewed the triage vital signs and the nursing notes.  Pertinent labs & imaging results that were available during my care of the patient were reviewed by me and considered in my medical decision making (see chart for details).  Acute URI  Vitals are stable, patient is in no  signs of distress nontoxic-appearing, lungs clear to auscultation and O2 saturation 97% on room air, stable for outpatient management, chest x-ray offered, declined, will prophylactically provide bacterial coverage due to description of symptoms  and also experiencing blood-tinged sputum which he has brought picture in to show this provider, additionally prescribed prednisone, Tessalon and Promethazine DM for management of cough, shortness of breath and wheeze, recommended additional over-the-counter medications for supportive measures and advised follow-up if symptoms continue to persist or worsen despite medication use Final Clinical Impressions(s) / UC Diagnoses   Final diagnoses:  Acute URI     Discharge Instructions      Based on the description of your symptoms antibiotics will be initiated today  Begin azithromycin as directed  Begin prednisone every morning with food for 5 days to open and relax the airway, should help calm shortness of breath and wheezing  May use Tessalon pill every 8 hours as needed to help calm coughing, may use cough syrup every 6 hours additionally, be mindful this may make you feel sleepy  No improvement seen within 72 hours of medication use please return to clinic for reevaluation and to complete chest x-ray imaging    You can take Tylenol and/or Ibuprofen as needed for fever reduction and pain relief.   For cough: honey 1/2 to 1 teaspoon (you can dilute the honey in water or another fluid).  You can also use guaifenesin and dextromethorphan for cough. You can use a humidifier for chest congestion and cough.  If you don't have a humidifier, you can sit in the bathroom with the hot shower running.      For sore throat: try warm salt water gargles, cepacol lozenges, throat spray, warm tea or water with lemon/honey, popsicles or ice, or OTC cold relief medicine for throat discomfort.   For congestion: take a daily anti-histamine like Zyrtec, Claritin, and a  oral decongestant, such as pseudoephedrine.  You can also use Flonase 1-2 sprays in each nostril daily.   It is important to stay hydrated: drink plenty of fluids (water, gatorade/powerade/pedialyte, juices, or teas) to keep your throat moisturized and help further relieve irritation/discomfort.    ED Prescriptions     Medication Sig Dispense Auth. Provider   azithromycin (ZITHROMAX) 250 MG tablet Take 1 tablet (250 mg total) by mouth daily. Take first 2 tablets together, then 1 every day until finished. 6 tablet Nathanal Hermiz R, NP   predniSONE (DELTASONE) 20 MG tablet Take 2 tablets (40 mg total) by mouth daily. 10 tablet Harshika Mago R, NP   benzonatate (TESSALON) 100 MG capsule Take 1 capsule (100 mg total) by mouth every 8 (eight) hours. 21 capsule Alfa Leibensperger R, NP   promethazine-dextromethorphan (PROMETHAZINE-DM) 6.25-15 MG/5ML syrup Take 5 mLs by mouth 4 (four) times daily as needed for cough. 118 mL Bauer Ausborn, Elita Boone, NP      PDMP not reviewed this encounter.   Valinda Hoar, NP 03/15/23 1642

## 2023-03-15 NOTE — ED Triage Notes (Signed)
Pt presents with blood-tinged sputum, chest congestion, cough, SOB  and runny nose x 1 week.
